# Patient Record
Sex: Female | Born: 1935 | Race: White | Hispanic: No | Smoking: Never smoker
Health system: Southern US, Community
[De-identification: ages and names within clinical notes are randomized; demographics above are authoritative.]

## PROBLEM LIST (undated history)

## (undated) DIAGNOSIS — I10 Essential (primary) hypertension: Secondary | ICD-10-CM

## (undated) DIAGNOSIS — K219 Gastro-esophageal reflux disease without esophagitis: Secondary | ICD-10-CM

## (undated) DIAGNOSIS — Z9889 Other specified postprocedural states: Secondary | ICD-10-CM

## (undated) DIAGNOSIS — R112 Nausea with vomiting, unspecified: Secondary | ICD-10-CM

---

## 2003-11-15 HISTORY — PX: BUNIONECTOMY: SHX129

## 2005-05-26 ENCOUNTER — Emergency Department (HOSPITAL_COMMUNITY): Admission: EM | Admit: 2005-05-26 | Discharge: 2005-05-26 | Payer: Self-pay | Admitting: *Deleted

## 2012-06-07 ENCOUNTER — Encounter (HOSPITAL_COMMUNITY): Payer: Self-pay | Admitting: Pharmacy Technician

## 2012-06-12 ENCOUNTER — Encounter (HOSPITAL_COMMUNITY)
Admission: RE | Admit: 2012-06-12 | Discharge: 2012-06-12 | Disposition: A | Discharge status: Home / Self Care | Payer: Medicare Other | Source: Ambulatory Visit | Attending: Podiatry | Admitting: Podiatry

## 2012-06-12 ENCOUNTER — Encounter (HOSPITAL_COMMUNITY): Payer: Self-pay

## 2012-06-12 ENCOUNTER — Other Ambulatory Visit: Payer: Self-pay

## 2012-06-12 HISTORY — DX: Other specified postprocedural states: Z98.890

## 2012-06-12 HISTORY — DX: Nausea with vomiting, unspecified: R11.2

## 2012-06-12 HISTORY — DX: Essential (primary) hypertension: I10

## 2012-06-12 HISTORY — DX: Gastro-esophageal reflux disease without esophagitis: K21.9

## 2012-06-12 LAB — BASIC METABOLIC PANEL
BUN: 23 mg/dL (ref 6–23)
Potassium: 4.1 mEq/L (ref 3.5–5.1)
Sodium: 143 mEq/L (ref 135–145)

## 2012-06-12 LAB — SURGICAL PCR SCREEN
MRSA, PCR: NEGATIVE
Staphylococcus aureus: NEGATIVE

## 2012-06-12 NOTE — Patient Instructions (Addendum)
22 MELANYE HIRALDO  06/12/2012   Your procedure is scheduled on:  06/13/12  Report to [facility] at 06:15 AM.  Call this number if you have problems the morning of surgery: [phone]   Remember:   Do not eat or drink:After Midnight.  Take these medicines the morning of surgery with A SIP OF WATER: Amlodipine-Benazepril, Nexium   Do not wear jewelry, make-up or nail polish.  Do not wear lotions, powders, or perfumes. You may wear deodorant.  Do not shave 48 hours prior to surgery. Men may shave face and neck.  Do not bring valuables to the hospital.  Contacts, dentures or bridgework may not be worn into surgery.  Leave suitcase in the car. After surgery it may be brought to your room.  For patients admitted to the hospital, checkout time is 11:00 AM the day of discharge.   Patients discharged the day of surgery will not be allowed to drive home.  Special Instructions: CHG Shower Use Special Wash: 1/2 bottle night before surgery and 1/2 bottle morning of surgery.   Please read over the following fact sheets that you were given: Pain Booklet, MRSA Information, Surgical Site Infection Prevention, Anesthesia Post-op Instructions and Care and Recovery After Surgery    Toe Injuries and Amputations You have cut off (amputated) part of your toe. Your outcome depends largely on how much was amputated. If just the tip is amputated, often the end of the toe will grow back and the toe may return much to the same as it was before the injury. If more of the toe is missing, your caregiver has done the best with the tissue remaining to allow you to keep as much toe as is possible or has finished the amputation at a level that will leave you with the most functional toe. This means a toe that will work the best for you. Please read the instructions outlined below and refer to this sheet in the next few weeks. These instructions provide you with general information on caring for yourself. Your caregiver may  also give you specific instructions. While your treatment has been done according to the most current medical practices available, unavoidable complications occasionally occur. If you have any problems or questions after discharge, call your caregiver. HOME CARE INSTRUCTIONS   You may resume a normal diet and activities as directed or allowed.   Keep your foot elevated when possible. This helps decrease pain and swelling.   Keep ice packs (a bag of ice wrapped in a towel) on the injured area for 15 to 20 minutes, 3 to 4 times per day, for the first two days. Use ice only if OK with your caregiver.   Change dressings if necessary or as directed.   Clean the wounded area as directed.   Only take over-the-counter or prescription medicines for pain, discomfort, or fever as directed by your caregiver.   Keep appointments as directed.  SEEK IMMEDIATE MEDICAL CARE IF:  There is redness, swelling, numbness or increasing pain in the wound.   There is pus coming from wound.   You have an unexplained oral temperature above 102 F (38.9 C) or as your caregiver suggests.   There is a bad (foul) smell coming from the wound or dressing.   The edges of the wound break open (the edges are not staying together) after sutures or staples have been removed.  Document Released: 09/21/2005 Document Revised: 10/20/2011 Document Reviewed: 02/18/2009 [facility] Patient Information 2012 [place], [place].  PATIENT INSTRUCTIONS POST-ANESTHESIA  IMMEDIATELY FOLLOWING SURGERY:  Do not drive or operate machinery for the first twenty four hours after surgery.  Do not make any important decisions for twenty four hours after surgery or while taking narcotic pain medications or sedatives.  If you develop intractable nausea and vomiting or a severe headache please notify your doctor immediately.  FOLLOW-UP:  Please make an appointment with your surgeon as instructed. You do not need to follow up with anesthesia unless  specifically instructed to do so.  WOUND CARE INSTRUCTIONS (if applicable):  Keep a dry clean dressing on the anesthesia/puncture wound site if there is drainage.  Once the wound has quit draining you may leave it open to air.  Generally you should leave the bandage intact for twenty four hours unless there is drainage.  If the epidural site drains for more than 36-48 hours please call the anesthesia department.  QUESTIONS?:  Please feel free to call your physician or the hospital operator if you have any questions, and they will be happy to assist you.

## 2012-06-13 ENCOUNTER — Encounter (HOSPITAL_COMMUNITY)
Admission: RE | Disposition: A | Discharge status: Home Health | Payer: Self-pay | Source: Ambulatory Visit | Attending: Podiatry

## 2012-06-13 ENCOUNTER — Ambulatory Visit (HOSPITAL_COMMUNITY)
Admission: RE | Admit: 2012-06-13 | Discharge: 2012-06-13 | Disposition: A | Discharge status: Home Health | Payer: Medicare Other | Source: Ambulatory Visit | Attending: Podiatry | Admitting: Podiatry

## 2012-06-13 ENCOUNTER — Encounter (HOSPITAL_COMMUNITY): Payer: Self-pay | Admitting: *Deleted

## 2012-06-13 ENCOUNTER — Ambulatory Visit (HOSPITAL_COMMUNITY): Payer: Medicare Other | Admitting: Anesthesiology

## 2012-06-13 ENCOUNTER — Encounter (HOSPITAL_COMMUNITY): Payer: Self-pay | Admitting: Anesthesiology

## 2012-06-13 ENCOUNTER — Ambulatory Visit (HOSPITAL_COMMUNITY): Payer: Medicare Other

## 2012-06-13 DIAGNOSIS — E119 Type 2 diabetes mellitus without complications: Secondary | ICD-10-CM | POA: Insufficient documentation

## 2012-06-13 DIAGNOSIS — I10 Essential (primary) hypertension: Secondary | ICD-10-CM | POA: Insufficient documentation

## 2012-06-13 DIAGNOSIS — Z01812 Encounter for preprocedural laboratory examination: Secondary | ICD-10-CM | POA: Insufficient documentation

## 2012-06-13 DIAGNOSIS — Z01810 Encounter for preprocedural cardiovascular examination: Secondary | ICD-10-CM | POA: Insufficient documentation

## 2012-06-13 DIAGNOSIS — M2040 Other hammer toe(s) (acquired), unspecified foot: Secondary | ICD-10-CM | POA: Insufficient documentation

## 2012-06-13 DIAGNOSIS — Z79899 Other long term (current) drug therapy: Secondary | ICD-10-CM | POA: Insufficient documentation

## 2012-06-13 HISTORY — PX: AMPUTATION: SHX166

## 2012-06-13 LAB — GLUCOSE, CAPILLARY: Glucose-Capillary: 116 mg/dL — ABNORMAL HIGH (ref 70–99)

## 2012-06-13 SURGERY — AMPUTATION DIGIT
Anesthesia: Monitor Anesthesia Care | Site: Toe | Laterality: Left | Wound class: Clean

## 2012-06-13 MED ORDER — DEXAMETHASONE SODIUM PHOSPHATE 4 MG/ML IJ SOLN
INTRAMUSCULAR | Status: AC
  Filled 2012-06-13: qty 1

## 2012-06-13 MED ORDER — ONDANSETRON HCL 4 MG/2ML IJ SOLN
INTRAMUSCULAR | Status: AC
  Filled 2012-06-13: qty 2

## 2012-06-13 MED ORDER — BUPIVACAINE HCL (PF) 0.5 % IJ SOLN
INTRAMUSCULAR | Status: DC | PRN
  Administered 2012-06-13: 20 mL

## 2012-06-13 MED ORDER — PROPOFOL 10 MG/ML IV EMUL
INTRAVENOUS | Status: DC | PRN
  Administered 2012-06-13: 35 ug/kg/min via INTRAVENOUS

## 2012-06-13 MED ORDER — MIDAZOLAM HCL 2 MG/2ML IJ SOLN
INTRAMUSCULAR | Status: AC
  Filled 2012-06-13: qty 2

## 2012-06-13 MED ORDER — 0.9 % SODIUM CHLORIDE (POUR BTL) OPTIME
TOPICAL | Status: DC | PRN
  Administered 2012-06-13: 1000 mL

## 2012-06-13 MED ORDER — LIDOCAINE HCL (PF) 1 % IJ SOLN
INTRAMUSCULAR | Status: AC
  Filled 2012-06-13: qty 5

## 2012-06-13 MED ORDER — MIDAZOLAM HCL 2 MG/2ML IJ SOLN
1.0000 mg | INTRAMUSCULAR | Status: DC | PRN
Start: 2012-06-13 — End: 2012-06-13
  Administered 2012-06-13 (×2): 2 mg via INTRAVENOUS

## 2012-06-13 MED ORDER — FENTANYL CITRATE 0.05 MG/ML IJ SOLN
INTRAMUSCULAR | Status: DC | PRN
  Administered 2012-06-13 (×2): 25 ug via INTRAVENOUS

## 2012-06-13 MED ORDER — PROPOFOL 10 MG/ML IV EMUL
INTRAVENOUS | Status: AC
  Filled 2012-06-13: qty 20

## 2012-06-13 MED ORDER — CEFAZOLIN SODIUM-DEXTROSE 2-3 GM-% IV SOLR: 2.0000 g | INTRAVENOUS | Status: DC

## 2012-06-13 MED ORDER — DEXAMETHASONE SODIUM PHOSPHATE 4 MG/ML IJ SOLN
4.0000 mg | Freq: Once | INTRAMUSCULAR | Status: AC
  Administered 2012-06-13: 4 mg via INTRAVENOUS

## 2012-06-13 MED ORDER — CEFAZOLIN SODIUM 1-5 GM-% IV SOLN
INTRAVENOUS | Status: DC | PRN
  Administered 2012-06-13: 2 g via INTRAVENOUS

## 2012-06-13 MED ORDER — MIDAZOLAM HCL 5 MG/5ML IJ SOLN
INTRAMUSCULAR | Status: DC | PRN
  Administered 2012-06-13 (×2): 1 mg via INTRAVENOUS

## 2012-06-13 MED ORDER — BUPIVACAINE HCL (PF) 0.5 % IJ SOLN
INTRAMUSCULAR | Status: AC
  Filled 2012-06-13: qty 30

## 2012-06-13 MED ORDER — LACTATED RINGERS IV SOLN
INTRAVENOUS | Status: DC
  Administered 2012-06-13: 1000 mL via INTRAVENOUS

## 2012-06-13 MED ORDER — ONDANSETRON HCL 4 MG/2ML IJ SOLN
4.0000 mg | Freq: Once | INTRAMUSCULAR | Status: AC
  Administered 2012-06-13: 4 mg via INTRAVENOUS

## 2012-06-13 MED ORDER — FENTANYL CITRATE 0.05 MG/ML IJ SOLN
INTRAMUSCULAR | Status: AC
  Filled 2012-06-13: qty 2

## 2012-06-13 MED ORDER — CEFAZOLIN SODIUM-DEXTROSE 2-3 GM-% IV SOLR
INTRAVENOUS | Status: AC
  Filled 2012-06-13: qty 50

## 2012-06-13 SURGICAL SUPPLY — 38 items
BAG HAMPER (MISCELLANEOUS) ×2 IMPLANT
BANDAGE ELASTIC 4 VELCRO NS (GAUZE/BANDAGES/DRESSINGS) ×2 IMPLANT
BANDAGE ESMARK 4X12 BL STRL LF (DISPOSABLE) ×1 IMPLANT
BANDAGE GAUZE ELAST BULKY 4 IN (GAUZE/BANDAGES/DRESSINGS) ×2 IMPLANT
BENZOIN TINCTURE PRP APPL 2/3 (GAUZE/BANDAGES/DRESSINGS) IMPLANT
BLADE AVERAGE 25X9 (BLADE) IMPLANT
BLADE SURG 15 STRL LF DISP TIS (BLADE) ×1 IMPLANT
BLADE SURG 15 STRL SS (BLADE) ×1
BNDG ESMARK 4X12 BLUE STRL LF (DISPOSABLE) ×2
CHLORAPREP W/TINT 26ML (MISCELLANEOUS) ×2 IMPLANT
CLOTH BEACON ORANGE TIMEOUT ST (SAFETY) ×2 IMPLANT
COVER LIGHT HANDLE STERIS (MISCELLANEOUS) ×4 IMPLANT
CUFF TOURNIQUET SINGLE 18IN (TOURNIQUET CUFF) ×2 IMPLANT
DECANTER SPIKE VIAL GLASS SM (MISCELLANEOUS) ×2 IMPLANT
DRSG ADAPTIC 3X8 NADH LF (GAUZE/BANDAGES/DRESSINGS) ×2 IMPLANT
ELECT REM PT RETURN 9FT ADLT (ELECTROSURGICAL) ×2
ELECTRODE REM PT RTRN 9FT ADLT (ELECTROSURGICAL) ×1 IMPLANT
GLOVE BIO SURGEON STRL SZ7.5 (GLOVE) ×2 IMPLANT
GLOVE EXAM NITRILE MD LF STRL (GLOVE) ×2 IMPLANT
GLOVE INDICATOR 7.0 STRL GRN (GLOVE) ×2 IMPLANT
GLOVE SS BIOGEL STRL SZ 6.5 (GLOVE) ×1 IMPLANT
GLOVE SUPERSENSE BIOGEL SZ 6.5 (GLOVE) ×1
GOWN STRL REIN XL XLG (GOWN DISPOSABLE) ×4 IMPLANT
KIT ROOM TURNOVER APOR (KITS) ×2 IMPLANT
MANIFOLD NEPTUNE II (INSTRUMENTS) ×2 IMPLANT
NEEDLE HYPO 27GX1-1/4 (NEEDLE) ×4 IMPLANT
NS IRRIG 1000ML POUR BTL (IV SOLUTION) ×2 IMPLANT
PACK BASIC LIMB (CUSTOM PROCEDURE TRAY) ×2 IMPLANT
PAD ARMBOARD 7.5X6 YLW CONV (MISCELLANEOUS) ×2 IMPLANT
RASP SM TEAR CROSS CUT (RASP) IMPLANT
SET BASIN LINEN APH (SET/KITS/TRAYS/PACK) ×2 IMPLANT
SOL PREP PROV IODINE SCRUB 4OZ (MISCELLANEOUS) IMPLANT
SPONGE GAUZE 4X4 12PLY (GAUZE/BANDAGES/DRESSINGS) ×2 IMPLANT
SPONGE LAP 18X18 X RAY DECT (DISPOSABLE) ×2 IMPLANT
SUT ETHILON 4 0 PS 2 18 (SUTURE) IMPLANT
SUT PROLENE 4 0 PS 2 18 (SUTURE) ×2 IMPLANT
SUT VIC AB 4-0 PS2 27 (SUTURE) ×2 IMPLANT
SYR CONTROL 10ML LL (SYRINGE) ×4 IMPLANT

## 2012-06-13 NOTE — Brief Op Note (Signed)
BRIEF OPERATIVE NOTE  SURGEON:   Dallas Schimke, DPM  OR STAFF:   Rogene Houston, RN - Circulator Sherri Sear, CST - Scrub Person Cyndie Chime, RN - Circulator Assistant   PREOPERATIVE DIAGNOSIS:   Hammertoe 2nd toe left foot  POSTOPERATIVE DIAGNOSIS: Same  PROCEDURE: Amputation of the 2nd toe left foot at the level of the metatarsophalangeal joint  ANESTHESIA:  Monitor Anesthesia Care   HEMOSTASIS:   Pneumatic ankle tourniquet set at 250 mmHg  ESTIMATED BLOOD LOSS:   Minimal (<5 cc)  MATERIALS USED:  None  INJECTABLES: Marcaine 0.5% plain; 20mL  PATHOLOGY:   2nd toe left foot  COMPLICATIONS:   None  INDICATIONS:  Painful hammertoe deformity of the 2nd digit left foot.  DICTATION:  Office manager

## 2012-06-13 NOTE — H&P (Signed)
HISTORY AND PHYSICAL INTERVAL NOTE:  06/13/2012  7:31 AM  Patty Jackson  has presented today for surgery, with the diagnosis of hammer toe 2nd toe left foot.  The various methods of treatment have been discussed with the patient.  No guarantees were given.  After consideration of risks, benefits and other options for treatment, the patient has consented to surgery.  I have reviewed the patients' chart and labs.    Patient Vitals for the past 24 hrs:  BP Temp Temp src Pulse Resp SpO2 Height Weight  06/13/12 0715 173/95 mmHg - - 66  14  100 % - -  06/13/12 0623 179/97 mmHg 97.7 F (36.5 C) Oral 56  12  100 % 5\' 5"  (1.651 m) 158 lb (71.668 kg)    A history and physical examination was performed in my office on 06/13/2012.  The patient was reexamined.  There have been no changes to this history and physical examination.  Dallas Schimke, DPM

## 2012-06-13 NOTE — Anesthesia Postprocedure Evaluation (Signed)
  Anesthesia Post-op Note  Patient: Patty Jackson  Procedure(s) Performed: Procedure(s) (LRB): AMPUTATION DIGIT (Left)  Patient Location: PACU and Short Stay  Anesthesia Type: MAC  Level of Consciousness: awake, alert , oriented and patient cooperative  Airway and Oxygen Therapy: Patient Spontanous Breathing  Post-op Pain: none  Post-op Assessment: Post-op Vital signs reviewed, Patient's Cardiovascular Status Stable, Respiratory Function Stable, Patent Airway, No signs of Nausea or vomiting and Pain level controlled  Post-op Vital Signs: Reviewed and stable  Complications: No apparent anesthesia complications

## 2012-06-13 NOTE — Transfer of Care (Signed)
Immediate Anesthesia Transfer of Care Note  Patient: Patty Jackson  Procedure(s) Performed: Procedure(s) (LRB): AMPUTATION DIGIT (Left)  Patient Location: PACU and Short Stay  Anesthesia Type: MAC  Level of Consciousness: awake and patient cooperative  Airway & Oxygen Therapy: Patient Spontanous Breathing and Patient connected to face mask oxygen  Post-op Assessment: Report given to PACU RN, Post -op Vital signs reviewed and stable and Patient moving all extremities  Post vital signs: Reviewed and stable  Complications: No apparent anesthesia complications

## 2012-06-13 NOTE — Anesthesia Preprocedure Evaluation (Addendum)
Anesthesia Evaluation  Patient identified by MRN, date of birth, ID band Patient awake    Reviewed: Allergy & Precautions, H&P , NPO status , Patient's Chart, lab work & pertinent test results  History of Anesthesia Complications (+) PONV  Airway Mallampati: II      Dental  (+) Teeth Intact   Pulmonary neg pulmonary ROS,  breath sounds clear to auscultation        Cardiovascular hypertension, Pt. on medications Rhythm:Regular     Neuro/Psych    GI/Hepatic GERD-  Medicated and Controlled,  Endo/Other  Well Controlled, Type 2, Oral Hypoglycemic Agents  Renal/GU      Musculoskeletal   Abdominal   Peds  Hematology   Anesthesia Other Findings   Reproductive/Obstetrics                           Anesthesia Physical Anesthesia Plan  ASA: III  Anesthesia Plan: MAC   Post-op Pain Management:    Induction: Intravenous  Airway Management Planned: Nasal Cannula  Additional Equipment:   Intra-op Plan:   Post-operative Plan:   Informed Consent: I have reviewed the patients History and Physical, chart, labs and discussed the procedure including the risks, benefits and alternatives for the proposed anesthesia with the patient or authorized representative who has indicated his/her understanding and acceptance.     Plan Discussed with:   Anesthesia Plan Comments:         Anesthesia Quick Evaluation

## 2012-06-13 NOTE — Op Note (Signed)
OPERATIVE REPORT  SURGEON:   Dallas Schimke, DPM  OR STAFF:   Rogene Houston, RN - Circulator Sherri Sear, CST - Scrub Person Cyndie Chime, RN - Circulator Assistant   PREOPERATIVE DIAGNOSIS:   Hammertoe 2nd toe left foot  POSTOPERATIVE DIAGNOSIS: Same  PROCEDURE: Amputation of the 2nd toe left foot at the level of the metatarsophalangeal joint   ANESTHESIA:  Monitor Anesthesia Care   HEMOSTASIS:   Pneumatic ankle tourniquet set at 250 mmHg  ESTIMATED BLOOD LOSS:   Minimal (<5cc)  MATERIALS USED:  None  INJECTABLES: 0.5% Marcaine plain  PATHOLOGY:   2nd toe left foot  COMPLICATIONS:   None  INDICATIONS:   Painful hammertoe deformity 2nd digit left foot  DESCRIPTION OF THE PROCEDURE:   The patient was brought to the operating room and placed on the operative table in the supine position.  A pneumatic ankle tourniquet was applied to the patient's ankle.  Following sedation, the surgical site was anesthetized with 0.5% Marcaine plain.  The foot was then prepped, scrubbed, and draped in the usual sterile technique.  The foot was elevated, exsanguinated and the pneumatic ankle tourniquet inflated to 250 mmHg.    Attention was directed to the second toe of the left foot.  2 converging to multiple incisions were made encompassing the second toe in its entirety.  Dissection was continued deep down to the level of the metatarsal phalangeal joint.  The second metatarsal phalangeal joint was disarticulated.  The second toe was passed from the operative field and sent to pathology for evaluation.  The area was copiously flushed with irrigant.  The subcutaneous tissues were reapproximated with Vicryl.  The skin was reapproximated with Vicryl and reinforced with Prolene.  A sterile compressive dressing was applied to the operative foot.  The patient's ankle tourniquet was deflated.  A prompt hyperemic response was noted to all digits of the operative foot.     The patient tolerated the procedure well.  The patient was then transferred to Short Stay with vital signs stable and vascular status intact to all toes of the operative foot.  Following a period of postoperative monitoring, the patient will be discharged home.

## 2012-06-18 ENCOUNTER — Encounter (HOSPITAL_COMMUNITY): Payer: Self-pay | Admitting: Podiatry

## 2013-06-16 IMAGING — CR DG FOOT COMPLETE 3+V*L*
3 series · 3 of 3 positions shown · non-contrast
Comparison: None.

CLINICAL DATA: Postoperative evaluation status post second digit
amputations

LEFT FOOT - COMPLETE 3+ VIEW

[view not recorded (1 of 3)]
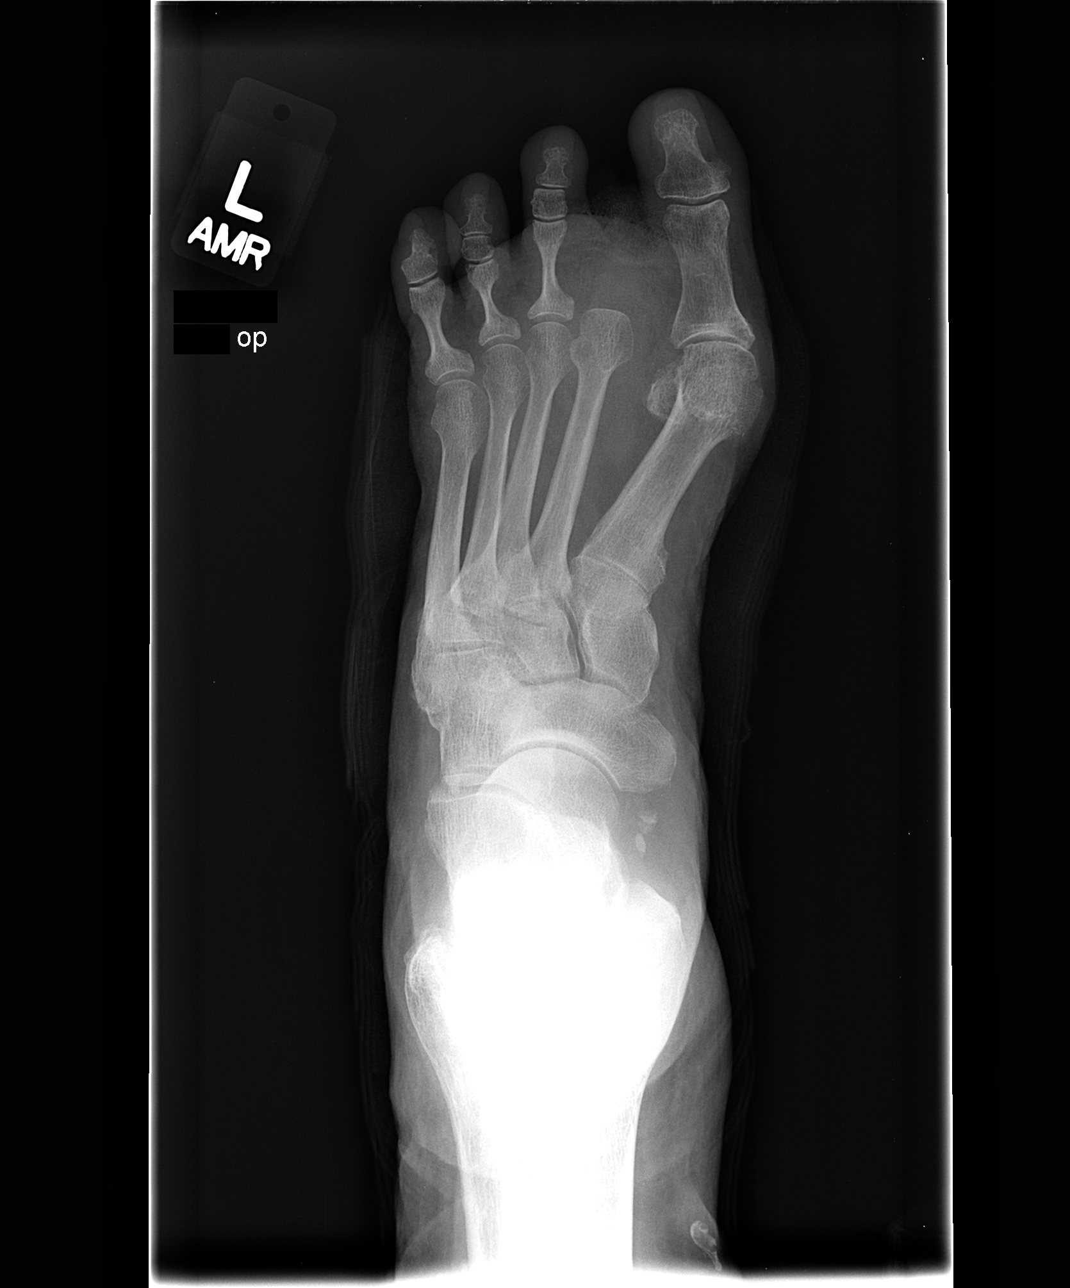

[view not recorded (2 of 3)]
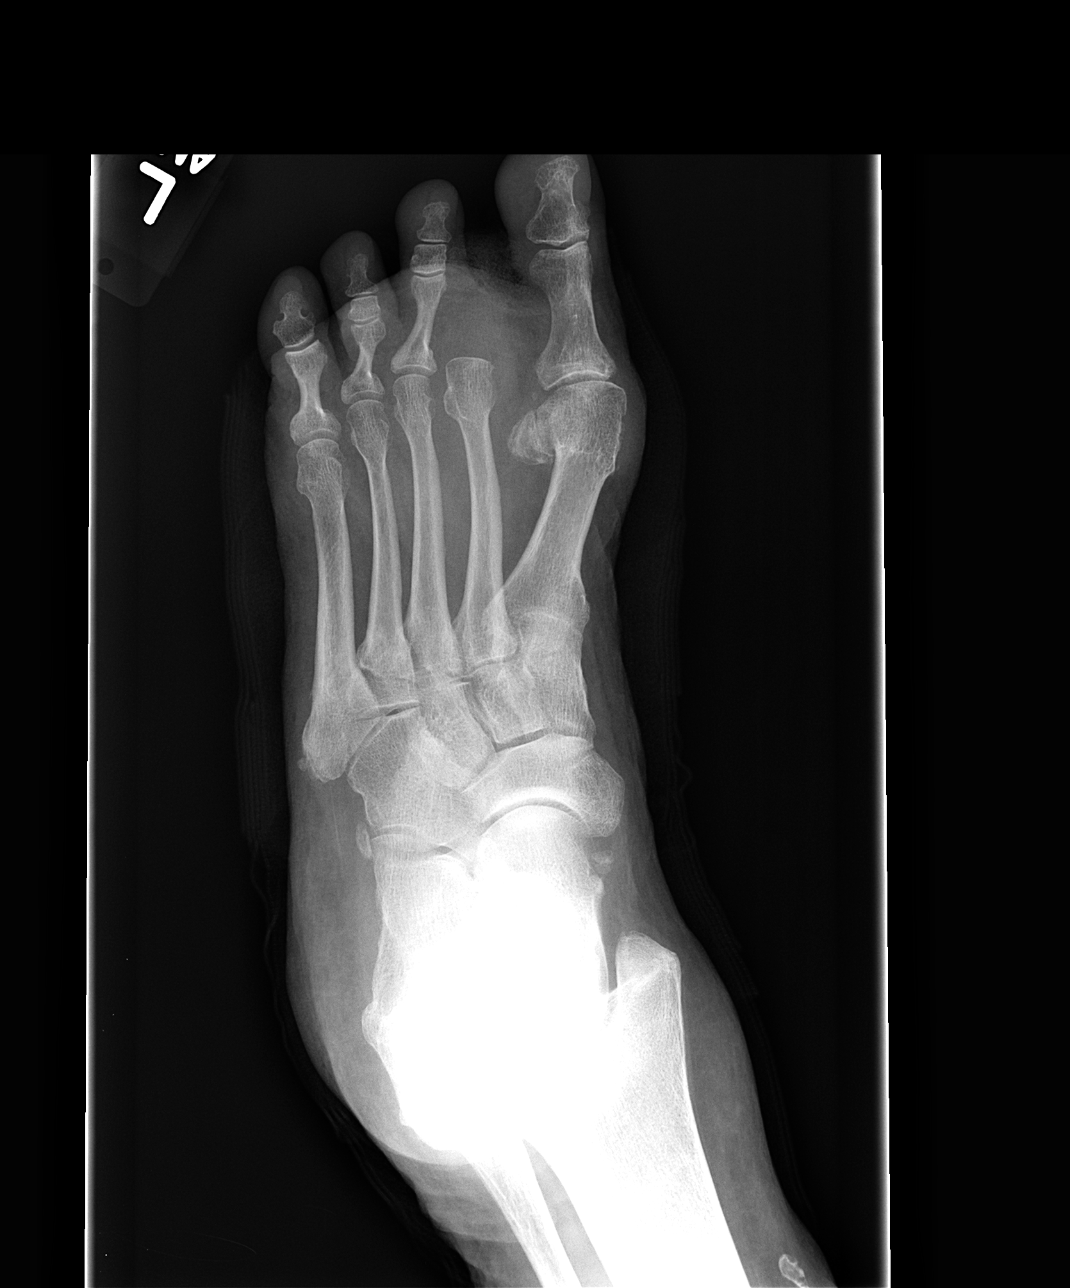

[view not recorded (3 of 3)]
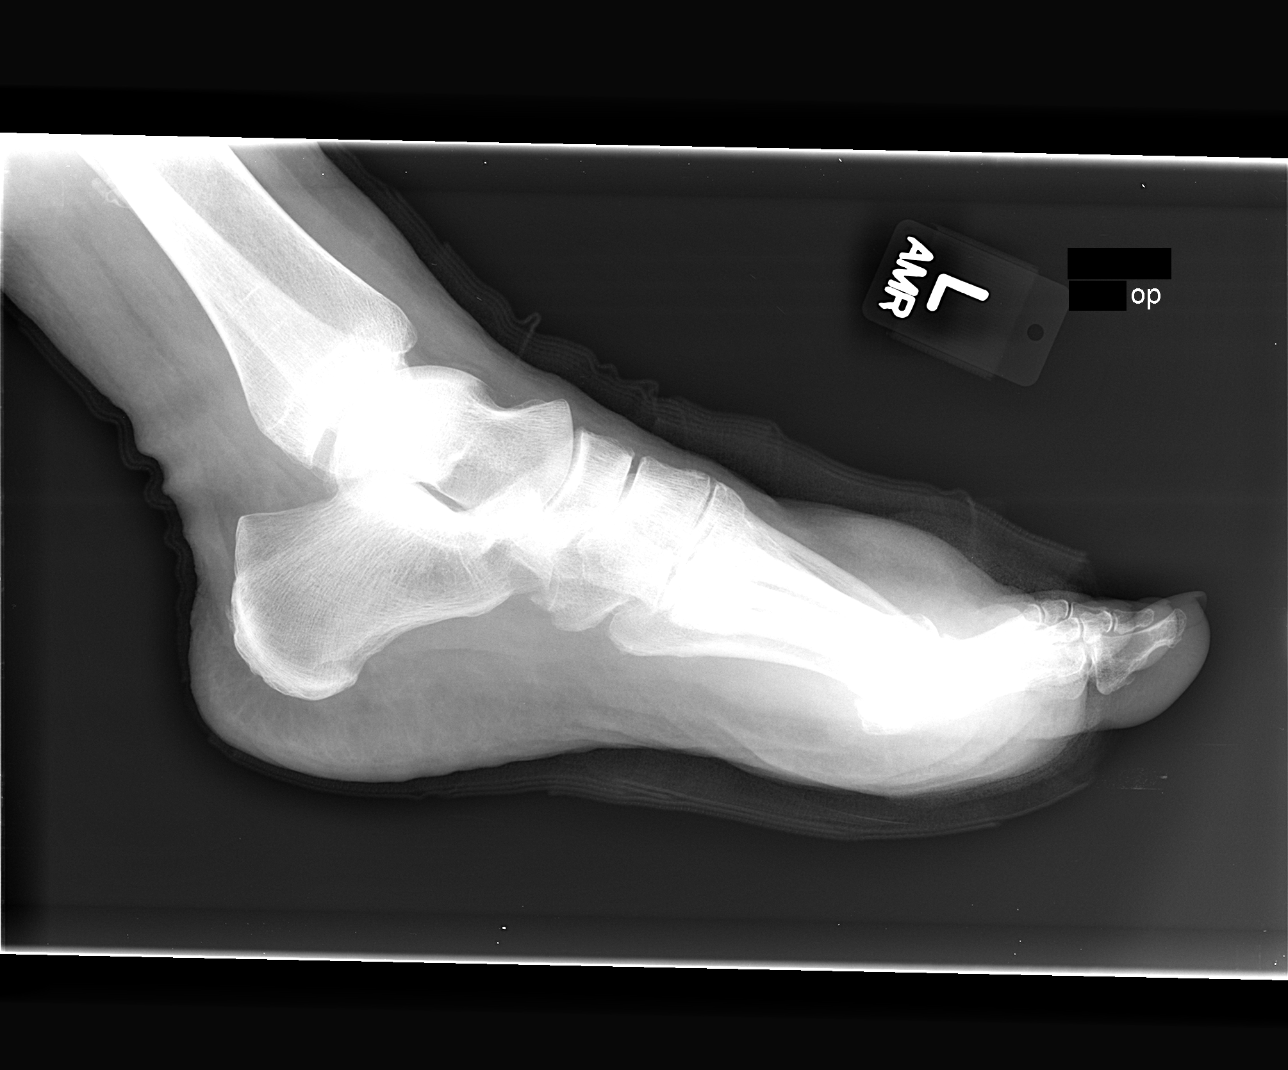

[3 of 3 positions shown; findings below may reference images not displayed]

FINDINGS: Three views of the left foot demonstrate surgical changes
of complete amputation of the second digit.  No residual phalangeal
osseous fragments remain.  The head of the second metatarsal is
intact. Associated soft tissue swelling over the dorsum of the
foot.

Severe hallux valgus angulation (36 degrees) of the great toe with
associated lateral bony bunion formation. Os peroneus incidentally
noted.  Several corticated ossific densities medial to the talar
neck may represent accessory navicular ossicles.
IMPRESSION: 1.  Postoperative changes of with successful second toe amputation.
No residual phalangeal bony fragments remain and the head of the
second metatarsal is intact. Associated soft tissue swelling.

2.  Severe hallux valgus angulation (36 degrees) with lateral bony
bunion formation.

## 2022-01-18 ENCOUNTER — Emergency Department (HOSPITAL_COMMUNITY): Payer: Medicare Other

## 2022-01-18 ENCOUNTER — Emergency Department (HOSPITAL_COMMUNITY)
Admission: EM | Admit: 2022-01-18 | Discharge: 2022-01-18 | Disposition: A | Discharge status: Home / Self Care | Payer: Medicare Other | Attending: Emergency Medicine | Admitting: Emergency Medicine

## 2022-01-18 ENCOUNTER — Encounter (HOSPITAL_COMMUNITY): Payer: Self-pay | Admitting: *Deleted

## 2022-01-18 DIAGNOSIS — E119 Type 2 diabetes mellitus without complications: Secondary | ICD-10-CM | POA: Insufficient documentation

## 2022-01-18 DIAGNOSIS — Z7984 Long term (current) use of oral hypoglycemic drugs: Secondary | ICD-10-CM | POA: Insufficient documentation

## 2022-01-18 DIAGNOSIS — J181 Lobar pneumonia, unspecified organism: Secondary | ICD-10-CM | POA: Insufficient documentation

## 2022-01-18 DIAGNOSIS — D72829 Elevated white blood cell count, unspecified: Secondary | ICD-10-CM | POA: Insufficient documentation

## 2022-01-18 DIAGNOSIS — I10 Essential (primary) hypertension: Secondary | ICD-10-CM | POA: Insufficient documentation

## 2022-01-18 DIAGNOSIS — R0781 Pleurodynia: Secondary | ICD-10-CM | POA: Diagnosis present

## 2022-01-18 DIAGNOSIS — Z7982 Long term (current) use of aspirin: Secondary | ICD-10-CM | POA: Insufficient documentation

## 2022-01-18 DIAGNOSIS — J189 Pneumonia, unspecified organism: Secondary | ICD-10-CM

## 2022-01-18 DIAGNOSIS — Z79899 Other long term (current) drug therapy: Secondary | ICD-10-CM | POA: Insufficient documentation

## 2022-01-18 DIAGNOSIS — R7989 Other specified abnormal findings of blood chemistry: Secondary | ICD-10-CM | POA: Insufficient documentation

## 2022-01-18 LAB — BASIC METABOLIC PANEL
Anion gap: 7 (ref 5–15)
BUN: 47 mg/dL — ABNORMAL HIGH (ref 8–23)
CO2: 23 mmol/L (ref 22–32)
Calcium: 8.7 mg/dL — ABNORMAL LOW (ref 8.9–10.3)
Chloride: 103 mmol/L (ref 98–111)
Creatinine, Ser: 2.05 mg/dL — ABNORMAL HIGH (ref 0.44–1.00)
GFR, Estimated: 23 mL/min — ABNORMAL LOW (ref >60)
Glucose, Bld: 221 mg/dL — ABNORMAL HIGH (ref 70–99)
Potassium: 3.5 mmol/L (ref 3.5–5.1)
Sodium: 133 mmol/L — ABNORMAL LOW (ref 135–145)

## 2022-01-18 LAB — CBC WITH DIFFERENTIAL/PLATELET
Band Neutrophils: 2 %
Basophils Absolute: 0 10*3/uL (ref 0.0–0.1)
Basophils Relative: 0 %
Eosinophils Absolute: 0 10*3/uL (ref 0.0–0.5)
Eosinophils Relative: 0 %
HCT: 33.9 % — ABNORMAL LOW (ref 36.0–46.0)
Hemoglobin: 11 g/dL — ABNORMAL LOW (ref 12.0–15.0)
Lymphocytes Relative: 4 %
Lymphs Abs: 0.7 10*3/uL (ref 0.7–4.0)
MCH: 30.1 pg (ref 26.0–34.0)
MCHC: 32.4 g/dL (ref 30.0–36.0)
MCV: 92.6 fL (ref 80.0–100.0)
Metamyelocytes Relative: 4 %
Monocytes Absolute: 1 10*3/uL (ref 0.1–1.0)
Monocytes Relative: 6 %
Myelocytes: 1 %
Neutro Abs: 14.8 10*3/uL — ABNORMAL HIGH (ref 1.7–7.7)
Neutrophils Relative %: 83 %
Platelets: 220 10*3/uL (ref 150–400)
RBC: 3.66 MIL/uL — ABNORMAL LOW (ref 3.87–5.11)
RDW: 12.7 % (ref 11.5–15.5)
WBC: 17.4 10*3/uL — ABNORMAL HIGH (ref 4.0–10.5)
nRBC: 0 % (ref 0.0–0.2)

## 2022-01-18 MED ORDER — AMOXICILLIN-POT CLAVULANATE 875-125 MG PO TABS: 1.0000 | ORAL_TABLET | Freq: Two times a day (BID) | ORAL | 0 refills | Status: AC

## 2022-01-18 MED ORDER — AMOXICILLIN-POT CLAVULANATE 875-125 MG PO TABS
1.0000 | ORAL_TABLET | Freq: Once | ORAL | Status: AC
  Administered 2022-01-18: 1 via ORAL
  Filled 2022-01-18: qty 1

## 2022-01-18 NOTE — ED Triage Notes (Signed)
Pain in right rib cage after child hit her with her head, worse with movement ?

## 2022-01-18 NOTE — ED Provider Notes (Signed)
[facility] EMERGENCY DEPARTMENT Provider Note   CSN: [id number] Arrival date & time: 01/18/22  1133     History Chief Complaint  Patient presents with   right rib pain    Patty Jackson is a 87 y.o. female with history of diabetes mellitus and hypertension who presents to the emergency department with right lower anterior chest wall pain that started over the weekend.  Patient states that Patty Jackson was playing with her granddaughter and Patty Jackson ran up and had butted her in the chest.  Her chest wall pain is worse with movement and with respirations.  No fever or chills.  HPI     Home Medications Prior to Admission medications   Medication Sig Start Date End Date Taking? Authorizing Provider  amoxicillin-clavulanate (AUGMENTIN) 875-125 MG tablet Take 1 tablet by mouth every 12 (twelve) hours. 01/18/22  Yes Raul Del, Chrystian Cupples M, PA-C  acetaminophen (TYLENOL) 500 MG tablet Take 500 mg by mouth every 6 (six) hours as needed. For pain    [provider]  amLODipine-benazepril (LOTREL) 10-20 MG per capsule Take 1 capsule by mouth daily.    [provider]  aspirin EC 81 MG tablet Take 81 mg by mouth daily.    [provider]  Calcium Carbonate-Vitamin D (CALCIUM + D PO) Take 1 tablet by mouth daily.    [provider]  esomeprazole (NEXIUM) 40 MG capsule Take 40 mg by mouth daily.    [provider]  fish oil-omega-3 fatty acids 1000 MG capsule Take 1 g by mouth daily.    [provider]  metFORMIN (GLUCOPHAGE) 500 MG tablet Take 500 mg by mouth 2 (two) times daily.    [provider]  vitamin B-12 (CYANOCOBALAMIN) 1000 MCG tablet Take 1,000 mcg by mouth every other day.    [provider]      Allergies    Patient has no known allergies.    Review of Systems   Review of Systems  All other systems reviewed and are negative.  Physical Exam Updated Vital Signs BP 123/66 (BP Location: Left Arm)    Pulse 79    Temp 97.9 F  (36.6 C) (Oral)    Resp 18    SpO2 92%  Physical Exam Vitals and nursing note reviewed.  Constitutional:      General: Patty Jackson is not in acute distress.    Appearance: Normal appearance.  HENT:     Head: Normocephalic and atraumatic.  Eyes:     General:        Right eye: No discharge.        Left eye: No discharge.  Cardiovascular:     Comments: Regular rate and rhythm.  S1/S2 are distinct without any evidence of murmur, rubs, or gallops.  Radial pulses are 2+ bilaterally.  Dorsalis pedis pulses are 2+ bilaterally.  No evidence of pedal edema. Pulmonary:     Comments: Clear to auscultation bilaterally.  Normal effort.  No respiratory distress.  No evidence of wheezes, rales, or rhonchi heard throughout. Chest:     Comments: There is no obvious ecchymosis over the right anterior chest wall.  Equal and symmetric chest rise.  Anterior chest wall underneath the breast is tender to palpation on the right. Abdominal:     General: Abdomen is flat. Bowel sounds are normal. There is no distension.     Tenderness: There is no abdominal tenderness. There is no guarding or rebound.  Musculoskeletal:        General:  Normal range of motion.     Cervical back: Neck supple.  Skin:    General: Skin is warm and dry.     Findings: No rash.  Neurological:     General: No focal deficit present.     Mental Status: Patty Jackson is alert.  Psychiatric:        Mood and Affect: Mood normal.        Behavior: Behavior normal.    ED Results / Procedures / Treatments   Labs (all labs ordered are listed, but only abnormal results are displayed) Labs Reviewed  CBC WITH DIFFERENTIAL/PLATELET - Abnormal; Notable for the following components:      Result Value   WBC 17.4 (*)    RBC 3.66 (*)    Hemoglobin 11.0 (*)    HCT 33.9 (*)    Neutro Abs 14.8 (*)    All other components within normal limits  BASIC METABOLIC PANEL - Abnormal; Notable for the following components:   Sodium 133 (*)    Glucose, Bld 221 (*)     BUN 47 (*)    Creatinine, Ser 2.05 (*)    Calcium 8.7 (*)    GFR, Estimated 23 (*)    All other components within normal limits    EKG None  Radiology DG Ribs Unilateral W/Chest Right  Result Date: 01/18/2022 CLINICAL DATA:  Right-sided rib pain after grandchild ran into her chest on Saturday. EXAM: RIGHT RIBS AND CHEST - 3+ VIEW COMPARISON:  None. FINDINGS: Large mass-like consolidation at the peripheral right lung base. No pneumothorax or pleural effusion. No definite rib fracture. IMPRESSION: 1. No definite rib fracture. 2. Large mass-like consolidation at the peripheral right lung base. Recommend CT of the chest (with contrast if possible) for further evaluation. Electronically Signed   By: Titus Dubin M.D.   On: 01/18/2022 12:43   CT CHEST WO CONTRAST  Result Date: 01/18/2022 CLINICAL DATA:  Lung mass.  * onc * EXAM: CT CHEST WITHOUT CONTRAST TECHNIQUE: Multidetector CT imaging of the chest was performed following the standard protocol without IV contrast. RADIATION DOSE REDUCTION: This exam was performed according to the departmental dose-optimization program which includes automated exposure control, adjustment of the mA and/or kV according to patient size and/or use of iterative reconstruction technique. COMPARISON:  None. FINDINGS: Cardiovascular: Coronary artery calcification and aortic atherosclerotic calcification. Mediastinum/Nodes: No axillary adenopathy. Enlarged RIGHT large trach paratracheal nodes measure to 15 mm (image 60/2). Enlarged RIGHT hilar node measures 18 mm. No supraclavicular adenopathy. Subcarinal node measures 12 mm. Lungs/Pleura: Dense lobar consolidation in the RIGHT middle lobe with air bronchograms. No obstructing lesion identified on noncontrast exam. Small RIGHT effusion. Angular pleuroparenchymal thickening at the LEFT lung base with a nodular component measuring 7 mm (image 78/4). Upper Abdomen: Adrenal glands normal. Large benign cyst of the LEFT kidney.  Nonobstructing upper pole RIGHT renal calculus. Musculoskeletal: No aggressive osseous lesion. Vertebroplasty of the lower thoracic spine IMPRESSION: 1. Dense lobar pneumonia of the RIGHT middle lobe. Followup CT chest is recommended in 3-4 weeks following trial of antibiotic therapy to ensure resolution and exclude underlying malignancy. 2. RIGHT hilar and RIGHT paratracheal adenopathy is favored reactive. Recommend follow-up with CT as above. 3. Nodular pleuroparenchymal thickening at the LEFT lung base. Recommend follow-up with CT as above. Electronically Signed   By: Suzy Bouchard M.D.   On: 01/18/2022 14:19    Procedures Procedures    Medications Ordered in ED Medications  amoxicillin-clavulanate (AUGMENTIN) 875-125 MG per tablet 1 tablet (  1 tablet Oral Given 01/18/22 1503)    ED Course/ Medical Decision Making/ A&P Clinical Course as of 01/18/22 1507  Tue Jan 18, 2022  1501 I discussed this case with my attending physician who cosigned this note including patient's presenting symptoms, physical exam, and planned diagnostics and interventions. Attending physician stated agreement with plan or made changes to plan which were implemented.    [CF]    Clinical Course User Index [CF] Hendricks Limes, PA-C                           Medical Decision Making Amount and/or Complexity of Data Reviewed Labs: ordered. Radiology: ordered.  Risk Prescription drug management.   This patient presents to the ED for concern of right-sided rib pain, this involves an extensive number of treatment options, and is a complaint that carries with it a high risk of complications and morbidity.  The differential diagnosis includes fracture, pneumonia, lung contusion, pneumothorax.   Co morbidities that complicate the patient evaluation  Diabetes Hypertension   Additional history obtained:  Additional history obtained from nursing note and old records External records from outside source  obtained and reviewed including office visits.  No relevant documentation for her visit today.   Lab Tests:  I Ordered, and personally interpreted labs.  The pertinent results include: CBC which reveals leukocytosis and new anemia for the patient.  BMP did show elevated creatinine and BUN which seems to be chronic for the patient although worse from baseline slightly.   Imaging Studies ordered:  I ordered imaging studies including x-ray of the right rib and lungs. I independently visualized and interpreted imaging which showed no evidence of fracture but consolidation in the right lobe. I agree with the radiologist interpretation  I ordered CT chest with contrast per radiologist request was unable to proceed with contrast secondary to patient's kidney function.  CT chest without contrast was ordered and did show dense right middle lobe pneumonia.   Cardiac Monitoring:  The patient was maintained on a cardiac monitor.  I personally viewed and interpreted the cardiac monitored which showed an underlying rhythm of: Normal sinus rhythm   Medicines ordered and prescription drug management:  I ordered medication including Augmentin for commune acquired pneumonia Reevaluation of the patient after these medicines showed that the patient stayed the same I have reviewed the patients home medicines and have made adjustments as needed   Problem List / ED Course:  Right anterior chest wall pain likely secondary to pneumonia.  Likely precipitated by the traumatic injury to her chest from her granddaughter.  I suspect that Patty Jackson was likely taking shallow breaths resulting in the pneumonia.  We ambulated patient with pulse ox and Patty Jackson hovered between 93 and 94% with a slight increase in her respiration rate.  No tachycardia was documented by nursing staff.  Patient's vital signs are otherwise completely normal and Patty Jackson wishes to go home.  I discussed that this is pretty significant pneumonia and gave her  strict return precautions.  I also expressed that Patty Jackson needs to follow-up with her primary care doctor within the next 3 days for further evaluation.  Patient expressed full understanding to this.  Patient again is adamant about going home and does not want to stay.   Reevaluation:  After the interventions noted above, I reevaluated the patient and found that they have :stayed the same   Social Determinants of Health:  Old age  Dispostion:  After consideration of the diagnostic results and the patients response to treatment, I feel that the patent would benefit from further evaluation with her primary care doctor.  I expressed numerous times that Patty Jackson needs to have close follow-up within the next 3 days for further evaluation with her primary care doctor.  Patty Jackson states that Patty Jackson will call him today.  I will send Augmentin to her pharmacy for commune acquired pneumonia.  Again strict turn precautions were discussed.  Patty Jackson is safe for discharge at this time.  Final Clinical Impression(s) / ED Diagnoses Final diagnoses:  Community acquired pneumonia of right middle lobe of lung    Rx / DC Orders ED Discharge Orders          Ordered    amoxicillin-clavulanate (AUGMENTIN) 875-125 MG tablet  Every 12 hours        01/18/22 1505              Myna Bright [place], [place] 01/18/22 1507    Milton Ferguson, MD 01/18/22 1705

## 2022-01-18 NOTE — Discharge Instructions (Signed)
Please call your primary care doctor in follow-up before the weekend to ensure we are going in the right direction.  Please take antibiotics as prescribed.  Please return to the emergency department for worsening symptoms like we discussed today. ?

## 2022-01-18 NOTE — ED Notes (Signed)
Pt granddaughter to nurses station, states pt has had a hard time controlling her blood sugar, most recent reading was 285. States pt has not been feeling well the last few days, loss of appetite, unsure of last insulin. ?

## 2022-11-11 ENCOUNTER — Ambulatory Visit: Payer: Medicare Other

## 2022-11-11 ENCOUNTER — Encounter: Payer: Self-pay | Admitting: Emergency Medicine

## 2022-11-11 ENCOUNTER — Ambulatory Visit
Admission: EM | Admit: 2022-11-11 | Discharge: 2022-11-11 | Disposition: A | Discharge status: Home / Self Care | Payer: Medicare Other | Attending: Family Medicine | Admitting: Family Medicine

## 2022-11-11 ENCOUNTER — Ambulatory Visit (INDEPENDENT_AMBULATORY_CARE_PROVIDER_SITE_OTHER): Payer: Medicare Other

## 2022-11-11 DIAGNOSIS — J22 Unspecified acute lower respiratory infection: Secondary | ICD-10-CM | POA: Insufficient documentation

## 2022-11-11 DIAGNOSIS — R059 Cough, unspecified: Secondary | ICD-10-CM | POA: Diagnosis not present

## 2022-11-11 DIAGNOSIS — R531 Weakness: Secondary | ICD-10-CM | POA: Diagnosis present

## 2022-11-11 DIAGNOSIS — R062 Wheezing: Secondary | ICD-10-CM | POA: Insufficient documentation

## 2022-11-11 DIAGNOSIS — Z1152 Encounter for screening for COVID-19: Secondary | ICD-10-CM | POA: Insufficient documentation

## 2022-11-11 DIAGNOSIS — R5383 Other fatigue: Secondary | ICD-10-CM | POA: Diagnosis not present

## 2022-11-11 MED ORDER — DOXYCYCLINE HYCLATE 100 MG PO CAPS: 100.0000 mg | ORAL_CAPSULE | Freq: Two times a day (BID) | ORAL | 0 refills | Status: AC

## 2022-11-11 MED ORDER — ALBUTEROL SULFATE HFA 108 (90 BASE) MCG/ACT IN AERS: 2.0000 | INHALATION_SPRAY | RESPIRATORY_TRACT | 0 refills | Status: AC | PRN

## 2022-11-11 MED ORDER — BENZONATATE 100 MG PO CAPS: 100.0000 mg | ORAL_CAPSULE | Freq: Three times a day (TID) | ORAL | 0 refills | Status: AC

## 2022-11-11 MED ORDER — IPRATROPIUM-ALBUTEROL 0.5-2.5 (3) MG/3ML IN SOLN
3.0000 mL | Freq: Once | RESPIRATORY_TRACT | Status: AC
  Administered 2022-11-11: 3 mL via RESPIRATORY_TRACT

## 2022-11-11 MED ORDER — GUAIFENESIN ER 600 MG PO TB12: 600.0000 mg | ORAL_TABLET | Freq: Two times a day (BID) | ORAL | 0 refills | Status: AC

## 2022-11-11 NOTE — Discharge Instructions (Signed)
If symptoms are worse at any time, go to the emergency department right away.

## 2022-11-11 NOTE — ED Triage Notes (Signed)
Vomiting, diarrhea, headache, cough, body aches since Christmas Day.  Symptoms became worse yesterday.

## 2022-11-12 LAB — SARS CORONAVIRUS 2 (TAT 6-24 HRS): SARS Coronavirus 2: NEGATIVE

## 2022-11-12 NOTE — ED Provider Notes (Signed)
[facility]    CSN: [id number] Arrival date & time: 11/11/22  1318      History   Chief Complaint Chief Complaint  Patient presents with   Fever   Cough   Generalized Body Aches    HPI Patty Jackson is a 88 y.o. female.   Presenting today with 5 day history of worsening diarrhea, vomiting, weakness, fatigue, cough, congestion, body aches, wheezing, chest tightness. States typically active but now since onset having trouble walking from her bed to her chair without being severely fatigued. Denies CP, SOB, abdominal pain, rashes. Tried mucinex with minimal relief. Hx of pnuemonia, no known chronic pulmonary dz.     Past Medical History:  Diagnosis Date   Diabetes mellitus    GERD (gastroesophageal reflux disease)    Hypertension    PONV (postoperative nausea and vomiting)     There are no problems to display for this patient.   Past Surgical History:  Procedure Laterality Date   AMPUTATION  06/13/2012   Procedure: AMPUTATION DIGIT;  Surgeon: Dallas Schimke, DPM;  Location: [facility];  Service: Orthopedics;  Laterality: Left;  Amputation 2nd Toe Left Foot   BUNIONECTOMY  2005   [facility].    OB History   No obstetric history on file.      Home Medications    Prior to Admission medications   Medication Sig Start Date End Date Taking? Authorizing Provider  albuterol (VENTOLIN HFA) 108 (90 Base) MCG/ACT inhaler Inhale 2 puffs into the lungs every 4 (four) hours as needed for wheezing or shortness of breath. 11/11/22  Yes Particia Nearing, PA-C  benzonatate (TESSALON) 100 MG capsule Take 1 capsule (100 mg total) by mouth every 8 (eight) hours. 11/11/22  Yes Particia Nearing, PA-C  doxycycline (VIBRAMYCIN) 100 MG capsule Take 1 capsule (100 mg total) by mouth 2 (two) times daily. 11/11/22  Yes Particia Nearing, PA-C  guaiFENesin (MUCINEX) 600 MG 12 hr tablet Take 1 tablet (600 mg  total) by mouth 2 (two) times daily. 11/11/22  Yes Particia Nearing, PA-C  insulin glargine (LANTUS) 100 UNIT/ML injection Inject 12 Units into the skin at bedtime.   Yes [provider]  acetaminophen (TYLENOL) 500 MG tablet Take 500 mg by mouth every 6 (six) hours as needed. For pain    [provider]  amLODipine-benazepril (LOTREL) 10-20 MG per capsule Take 1 capsule by mouth daily.    [provider]  amoxicillin-clavulanate (AUGMENTIN) 875-125 MG tablet Take 1 tablet by mouth every 12 (twelve) hours. 01/18/22   Teressa Lower, PA-C  aspirin EC 81 MG tablet Take 81 mg by mouth daily.    [provider]  Calcium Carbonate-Vitamin D (CALCIUM + D PO) Take 1 tablet by mouth daily.    [provider]  esomeprazole (NEXIUM) 40 MG capsule Take 40 mg by mouth daily.    [provider]  fish oil-omega-3 fatty acids 1000 MG capsule Take 1 g by mouth daily.    [provider]  metFORMIN (GLUCOPHAGE) 500 MG tablet Take 500 mg by mouth 2 (two) times daily.    [provider]  vitamin B-12 (CYANOCOBALAMIN) 1000 MCG tablet Take 1,000 mcg by mouth every other day.    [provider]    Family History History reviewed. No pertinent family history.  Social History Social History   Tobacco Use   Smoking status: Never  Substance Use Topics  Alcohol use: No   Drug use: No     Allergies   Patient has no known allergies.   Review of Systems Review of Systems PER HPI  Physical Exam Triage Vital Signs ED Triage Vitals  Enc Vitals Group     BP 11/11/22 1455 103/68     Pulse Rate 11/11/22 1455 71     Resp 11/11/22 1455 18     Temp 11/11/22 1455 98.3 F (36.8 C)     Temp Source 11/11/22 1455 Oral     SpO2 11/11/22 1455 91 %     Weight --      Height --      Head Circumference --      Peak Flow --      Pain Score 11/11/22 1457 0     Pain Loc --      Pain Edu? --      Excl. in GC? --    No data  found.  Updated Vital Signs BP 103/68 (BP Location: Right Arm)   Pulse 64   Temp 98.3 F (36.8 C) (Oral)   Resp 18   SpO2 91%   Visual Acuity Right Eye Distance:   Left Eye Distance:   Bilateral Distance:    Right Eye Near:   Left Eye Near:    Bilateral Near:     Physical Exam Vitals and nursing note reviewed.  Constitutional:      Comments: Mildly lethargic appearing, weak  HENT:     Head: Atraumatic.     Right Ear: Tympanic membrane and external ear normal.     Left Ear: Tympanic membrane and external ear normal.     Nose: Congestion present.     Mouth/Throat:     Mouth: Mucous membranes are moist.     Pharynx: Posterior oropharyngeal erythema present.  Eyes:     Extraocular Movements: Extraocular movements intact.     Conjunctiva/sclera: Conjunctivae normal.  Cardiovascular:     Rate and Rhythm: Normal rate and regular rhythm.     Heart sounds: Normal heart sounds.  Pulmonary:     Effort: Pulmonary effort is normal.     Breath sounds: Wheezing present.  Musculoskeletal:        General: Normal range of motion.     Cervical back: Normal range of motion and neck supple.  Skin:    General: Skin is warm and dry.  Neurological:     Mental Status: She is oriented to person, place, and time.  Psychiatric:        Mood and Affect: Mood normal.        Thought Content: Thought content normal.      UC Treatments / Results  Labs (all labs ordered are listed, but only abnormal results are displayed) Labs Reviewed  SARS CORONAVIRUS 2 (TAT 6-24 HRS)    EKG   Radiology DG Chest 1 View  Result Date: 11/11/2022 CLINICAL DATA:  1 week cough, SOB, wheezing, weakness EXAM: CHEST  1 VIEW COMPARISON:  CT chest 01/18/2022, chest x-ray 11/11/2022 3:43 p.m. FINDINGS: The heart and mediastinal contours are within normal limits. Question developing vague peripheral left lower lung zone wedge-shaped airspace opacity. Chronic coarsened markings with no overt pulmonary edema. No  pleural effusion. No pneumothorax. No acute osseous abnormality. IMPRESSION: Question developing vague peripheral left lower lung zone wedge-shaped airspace opacity. Consider further evaluation with PA and lateral view of the chest. If patient is stable, consider CT PA. Electronically Signed   By: Blanchie Serve  Tessie Fass M.D.   On: 11/11/2022 17:09   DG Chest 2 View  Result Date: 11/11/2022 CLINICAL DATA:  Productive cough, weakness, and fatigue for 1 week, history of pneumonia EXAM: CHEST - 2 VIEW COMPARISON:  01/18/2022 FINDINGS: Upper normal size of cardiac silhouette. Mediastinal contours and pulmonary vascularity normal. Peribronchial thickening with resolution of previously identified RIGHT basilar infiltrate. Lungs now clear. No pleural effusion or pneumothorax. Bones demineralized with spinal augmentation procedure at T11 and T12. IMPRESSION: Bronchitic changes without infiltrate. Electronically Signed   By: Ulyses Southward M.D.   On: 11/11/2022 16:12    Procedures Procedures (including critical care time)  Medications Ordered in UC Medications  ipratropium-albuterol (DUONEB) 0.5-2.5 (3) MG/3ML nebulizer solution 3 mL (3 mLs Nebulization Given 11/11/22 1547)    Initial Impression / Assessment and Plan / UC Course  I have reviewed the triage vital signs and the nursing notes.  Pertinent labs & imaging results that were available during my care of the patient were reviewed by me and considered in my medical decision making (see chart for details).     Minimal improvement with duoneb treatment and CXR showing bronchitis changes with questionable developing opacity. Concern for lower resp infection. Treat with doxycycline, mucinex, tessalon, albuterol inhaler and supportive home care. O2 saturations borderline low today, discussed with patient and family member present to monitor closely for worsening sxs or worsening O2 saturation and go to ED for these  50 min spent in direct patient care,  coordination and eduation  Final Clinical Impressions(s) / UC Diagnoses   Final diagnoses:  Lower respiratory infection  Weakness  Wheezing     Discharge Instructions      If symptoms are worse at any time, go to the emergency department right away.    ED Prescriptions     Medication Sig Dispense Auth. Provider   doxycycline (VIBRAMYCIN) 100 MG capsule Take 1 capsule (100 mg total) by mouth 2 (two) times daily. 20 capsule Particia Nearing, [place]   albuterol (VENTOLIN HFA) 108 (90 Base) MCG/ACT inhaler Inhale 2 puffs into the lungs every 4 (four) hours as needed for wheezing or shortness of breath. 18 g Particia Nearing, PA-C   guaiFENesin (MUCINEX) 600 MG 12 hr tablet Take 1 tablet (600 mg total) by mouth 2 (two) times daily. 20 tablet Particia Nearing, [place]   benzonatate (TESSALON) 100 MG capsule Take 1 capsule (100 mg total) by mouth every 8 (eight) hours. 21 capsule Particia Nearing, [place]      PDMP not reviewed this encounter.   Particia Nearing, [place] 11/12/22 2041

## 2022-11-27 ENCOUNTER — Other Ambulatory Visit: Payer: Self-pay | Admitting: Family Medicine

## 2023-03-17 ENCOUNTER — Other Ambulatory Visit: Payer: Self-pay | Admitting: Family Medicine
